# Patient Record
Sex: Female | Born: 1980 | Race: White | Hispanic: No | Marital: Single | Smoking: Former smoker
Health system: Southern US, Community
[De-identification: ages and names within clinical notes are randomized; demographics above are authoritative.]

## PROBLEM LIST (undated history)

## (undated) DIAGNOSIS — F4310 Post-traumatic stress disorder, unspecified: Secondary | ICD-10-CM

## (undated) DIAGNOSIS — F419 Anxiety disorder, unspecified: Secondary | ICD-10-CM

## (undated) DIAGNOSIS — F3181 Bipolar II disorder: Secondary | ICD-10-CM

## (undated) DIAGNOSIS — F329 Major depressive disorder, single episode, unspecified: Secondary | ICD-10-CM

## (undated) DIAGNOSIS — F32A Depression, unspecified: Secondary | ICD-10-CM

## (undated) DIAGNOSIS — D6851 Activated protein C resistance: Secondary | ICD-10-CM

## (undated) DIAGNOSIS — F309 Manic episode, unspecified: Secondary | ICD-10-CM

## (undated) DIAGNOSIS — F1910 Other psychoactive substance abuse, uncomplicated: Secondary | ICD-10-CM

---

## 2012-12-29 ENCOUNTER — Encounter (HOSPITAL_COMMUNITY): Payer: Self-pay | Admitting: Emergency Medicine

## 2012-12-29 ENCOUNTER — Emergency Department (HOSPITAL_COMMUNITY)
Admission: EM | Admit: 2012-12-29 | Discharge: 2012-12-29 | Disposition: A | Discharge status: Home / Self Care | Payer: Medicare Other | Attending: Emergency Medicine | Admitting: Emergency Medicine

## 2012-12-29 DIAGNOSIS — Z880 Allergy status to penicillin: Secondary | ICD-10-CM | POA: Insufficient documentation

## 2012-12-29 DIAGNOSIS — Z862 Personal history of diseases of the blood and blood-forming organs and certain disorders involving the immune mechanism: Secondary | ICD-10-CM | POA: Insufficient documentation

## 2012-12-29 DIAGNOSIS — Z8659 Personal history of other mental and behavioral disorders: Secondary | ICD-10-CM | POA: Insufficient documentation

## 2012-12-29 DIAGNOSIS — F17200 Nicotine dependence, unspecified, uncomplicated: Secondary | ICD-10-CM | POA: Insufficient documentation

## 2012-12-29 DIAGNOSIS — F411 Generalized anxiety disorder: Secondary | ICD-10-CM | POA: Insufficient documentation

## 2012-12-29 DIAGNOSIS — R112 Nausea with vomiting, unspecified: Secondary | ICD-10-CM | POA: Insufficient documentation

## 2012-12-29 DIAGNOSIS — F3189 Other bipolar disorder: Secondary | ICD-10-CM | POA: Insufficient documentation

## 2012-12-29 DIAGNOSIS — F1120 Opioid dependence, uncomplicated: Secondary | ICD-10-CM | POA: Insufficient documentation

## 2012-12-29 HISTORY — DX: Activated protein C resistance: D68.51

## 2012-12-29 HISTORY — DX: Manic episode, unspecified: F30.9

## 2012-12-29 HISTORY — DX: Bipolar II disorder: F31.81

## 2012-12-29 HISTORY — DX: Major depressive disorder, single episode, unspecified: F32.9

## 2012-12-29 HISTORY — DX: Depression, unspecified: F32.A

## 2012-12-29 HISTORY — DX: Anxiety disorder, unspecified: F41.9

## 2012-12-29 HISTORY — DX: Post-traumatic stress disorder, unspecified: F43.10

## 2012-12-29 LAB — CBC
HCT: 43.4 % (ref 36.0–46.0)
Hemoglobin: 14.6 g/dL (ref 12.0–15.0)
MCH: 31.9 pg (ref 26.0–34.0)
MCHC: 33.6 g/dL (ref 30.0–36.0)
MCV: 95 fL (ref 78.0–100.0)
Platelets: 181 10*3/uL (ref 150–400)
RBC: 4.57 MIL/uL (ref 3.87–5.11)
RDW: 12.6 % (ref 11.5–15.5)
WBC: 4.4 10*3/uL (ref 4.0–10.5)

## 2012-12-29 LAB — COMPREHENSIVE METABOLIC PANEL
ALT: 50 U/L — ABNORMAL HIGH (ref 0–35)
AST: 42 U/L — ABNORMAL HIGH (ref 0–37)
Albumin: 3.7 g/dL (ref 3.5–5.2)
Alkaline Phosphatase: 135 U/L — ABNORMAL HIGH (ref 39–117)
BUN: 12 mg/dL (ref 6–23)
CO2: 24 mEq/L (ref 19–32)
Calcium: 9.1 mg/dL (ref 8.4–10.5)
Chloride: 105 mEq/L (ref 96–112)
Creatinine, Ser: 0.79 mg/dL (ref 0.50–1.10)
GFR calc Af Amer: >90 mL/min (ref >90)
GFR calc non Af Amer: >90 mL/min (ref >90)
Glucose, Bld: 122 mg/dL — ABNORMAL HIGH (ref 70–99)
Potassium: 3.6 mEq/L (ref 3.5–5.1)
Sodium: 140 mEq/L (ref 135–145)
Total Bilirubin: 0.6 mg/dL (ref 0.3–1.2)
Total Protein: 7.6 g/dL (ref 6.0–8.3)

## 2012-12-29 LAB — ETHANOL: Alcohol, Ethyl (B): <11 mg/dL (ref 0–11)

## 2012-12-29 LAB — SALICYLATE LEVEL: Salicylate Lvl: <2 mg/dL — ABNORMAL LOW (ref 2.8–20.0)

## 2012-12-29 LAB — RAPID URINE DRUG SCREEN, HOSP PERFORMED
Amphetamines: NOT DETECTED
Barbiturates: NOT DETECTED
Benzodiazepines: POSITIVE — AB
Cocaine: POSITIVE — AB
Opiates: NOT DETECTED
Tetrahydrocannabinol: POSITIVE — AB

## 2012-12-29 LAB — ACETAMINOPHEN LEVEL: Acetaminophen (Tylenol), Serum: <15 ug/mL (ref 10–30)

## 2012-12-29 MED ORDER — ONDANSETRON HCL 4 MG PO TABS: 4.0000 mg | ORAL_TABLET | Freq: Four times a day (QID) | ORAL | Status: DC

## 2012-12-29 MED ORDER — HYDROCODONE-ACETAMINOPHEN 10-325 MG PO TABS: 1.0000 | ORAL_TABLET | Freq: Four times a day (QID) | ORAL | Status: DC

## 2012-12-29 MED ORDER — LORAZEPAM 1 MG PO TABS
1.0000 mg | ORAL_TABLET | Freq: Three times a day (TID) | ORAL | Status: DC | PRN
  Filled 2012-12-29: qty 1

## 2012-12-29 MED ORDER — DICYCLOMINE HCL 20 MG PO TABS: 20.0000 mg | ORAL_TABLET | Freq: Four times a day (QID) | ORAL | Status: DC | PRN

## 2012-12-29 MED ORDER — ZOLPIDEM TARTRATE 5 MG PO TABS: 5.0000 mg | ORAL_TABLET | Freq: Every evening | ORAL | Status: DC | PRN

## 2012-12-29 MED ORDER — HYDROXYZINE HCL 25 MG PO TABS: 50.0000 mg | ORAL_TABLET | Freq: Three times a day (TID) | ORAL | Status: DC | PRN

## 2012-12-29 MED ORDER — ROPINIROLE HCL 0.25 MG PO TABS
0.2500 mg | ORAL_TABLET | Freq: Every day | ORAL | Status: DC
  Filled 2012-12-29: qty 1

## 2012-12-29 MED ORDER — NICOTINE 21 MG/24HR TD PT24: 21.0000 mg | MEDICATED_PATCH | Freq: Every day | TRANSDERMAL | Status: DC | PRN

## 2012-12-29 MED ORDER — DIAZEPAM 5 MG PO TABS: 5.0000 mg | ORAL_TABLET | Freq: Three times a day (TID) | ORAL | Status: DC

## 2012-12-29 MED ORDER — ONDANSETRON HCL 4 MG PO TABS: 4.0000 mg | ORAL_TABLET | Freq: Three times a day (TID) | ORAL | Status: DC | PRN

## 2012-12-29 MED ORDER — ZIPRASIDONE HCL 20 MG PO CAPS: 40.0000 mg | ORAL_CAPSULE | Freq: Two times a day (BID) | ORAL | Status: DC

## 2012-12-29 MED ORDER — GABAPENTIN 100 MG PO CAPS
100.0000 mg | ORAL_CAPSULE | Freq: Three times a day (TID) | ORAL | Status: DC
  Filled 2012-12-29 (×2): qty 1

## 2012-12-29 MED ORDER — IBUPROFEN 400 MG PO TABS: 600.0000 mg | ORAL_TABLET | Freq: Three times a day (TID) | ORAL | Status: DC | PRN

## 2012-12-29 MED ORDER — CLONIDINE HCL 0.1 MG PO TABS: 0.1000 mg | ORAL_TABLET | Freq: Two times a day (BID) | ORAL | Status: DC

## 2012-12-29 MED ORDER — LORAZEPAM 1 MG PO TABS
1.0000 mg | ORAL_TABLET | Freq: Once | ORAL | Status: AC
  Administered 2012-12-29: 1 mg via ORAL
  Filled 2012-12-29: qty 2

## 2012-12-29 MED ORDER — LOPERAMIDE HCL 2 MG PO CAPS: 2.0000 mg | ORAL_CAPSULE | Freq: Four times a day (QID) | ORAL | Status: DC | PRN

## 2012-12-29 MED ORDER — ACETAMINOPHEN 325 MG PO TABS: 650.0000 mg | ORAL_TABLET | ORAL | Status: DC | PRN

## 2012-12-29 MED ORDER — ONDANSETRON 4 MG PO TBDP
4.0000 mg | ORAL_TABLET | Freq: Once | ORAL | Status: AC
  Administered 2012-12-29: 4 mg via ORAL
  Filled 2012-12-29: qty 1
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
## 2012-12-29 NOTE — ED Notes (Signed)
Pt reports she wants to get clean from shooting oxycodone, oxycontin, taking valium and smoking weed. Pt alert, orientedx4, NAD, tearful. Denies SI or HI. Cooperative with care.

## 2012-12-29 NOTE — ED Provider Notes (Signed)
History    CSN: [id number] Arrival date & time 12/29/12  1058  First MD Initiated Contact with Patient 12/29/12 1155     Chief Complaint  Patient presents with  . Medical Clearance   (Consider location/radiation/quality/duration/timing/severity/associated sxs/prior Treatment) The history is provided by the patient.   Patient presents to the ED for detox. Patient states she has been abusing narcotics for the past 3 years, specifically oxycodone, OxyContin, and Opana- last use late yesterday evening (1 PO oxycodone).  She states she injects, snorts, and orally take these medications.  States she has cut back on her use recently, but has been unable to completely "kick the habit" on her own.  She also admits to recent marijuana use- last use 3 days ago.  Denies any recent EtOH.  Prior detox 1.5 years ago- relapse due to emotional stress with significant other.  Patient states she is desiring help now because she feels like her addiction is interfering with her daily life and her ability to care for her children.  She has a strong support system at home to help facilitate this change.   Has been experiencing anxiety, nausea, vomiting, and diarrhea since earlier this am.  No tremors or seizure activity.  No chest pain, SOB, abdominal pain, dizziness, weakness, confusion, or AMS.  Denies any SI, HI, AVH.   Past Medical History  Diagnosis Date  . Factor 5 Leiden mutation, heterozygous   . Anxiety   . Bipolar 2 disorder   . Depression   . PTSD (post-traumatic stress disorder)   . Mania    History reviewed. No pertinent past surgical history. History reviewed. No pertinent family history. History  Substance Use Topics  . Smoking status: Current Every Day Smoker -- 0.50 packs/day    Types: Cigarettes  . Smokeless tobacco: Not on file  . Alcohol Use: No   OB History   Grav Para Term Preterm Abortions TAB SAB Ect Mult Living                 Review of Systems  Gastrointestinal: Positive  for nausea and vomiting.  Psychiatric/Behavioral:       Detox  All other systems reviewed and are negative.    Allergies  Review of patient's allergies indicates no known allergies.  Home Medications  No current outpatient prescriptions on file. BP 126/80  Pulse 94  Temp(Src) 97.7 F (36.5 C) (Oral)  SpO2 100%  LMP 12/29/2012  Physical Exam  Nursing note and vitals reviewed. Constitutional: She is oriented to person, place, and time. She appears well-developed and well-nourished.  HENT:  Head: Normocephalic and atraumatic.  Mouth/Throat: Oropharynx is clear and moist.  Eyes: Conjunctivae and EOM are normal. Pupils are equal, round, and reactive to light.  Neck: Normal range of motion.  Cardiovascular: Normal rate, regular rhythm and normal heart sounds.   Pulmonary/Chest: Effort normal and breath sounds normal.  Abdominal: Soft. Bowel sounds are normal.  Musculoskeletal: Normal range of motion.  Neurological: She is alert and oriented to person, place, and time.  Skin: Skin is warm and dry.  Psychiatric: Her speech is normal. Thought content normal. Her mood appears anxious. She expresses no homicidal and no suicidal ideation. She expresses no suicidal plans and no homicidal plans.  Pt anxious and tearful, no SI/HI/AVH    ED Course  Procedures (including critical care time) Labs Reviewed  COMPREHENSIVE METABOLIC PANEL - Abnormal; Notable for the following:    Glucose, Bld 122 (*)    AST 42 (*)  ALT 50 (*)    Alkaline Phosphatase 135 (*)    All other components within normal limits  SALICYLATE LEVEL - Abnormal; Notable for the following:    Salicylate Lvl <2.0 (*)    All other components within normal limits  URINE RAPID DRUG SCREEN (HOSP PERFORMED) - Abnormal; Notable for the following:    Cocaine POSITIVE (*)    Benzodiazepines POSITIVE (*)    Tetrahydrocannabinol POSITIVE (*)    All other components within normal limits  ACETAMINOPHEN LEVEL  CBC  ETHANOL   POCT PREGNANCY, URINE   No results found. No diagnosis found.  MDM   Labs as above, urine drug screen positive for cocaine benzodiazepine and marijuana.  Patient is agitated and nauseous at my initial evaluation- Ativan and Zofran given.  Patient medically cleared and removed to psych hold.  Holding orders and home meds placed.  [facility] consulted, they will see her.   Garlon Hatchet, PA-C 12/29/12 1542

## 2012-12-29 NOTE — ED Notes (Addendum)
Pt states she does five Roxicet and "I shoot 2-40's" per day. "I'm dying..I'm having diarrhea, feel like I'm going to vomit.Peggye Form got the shakes". "When can I get something for my sx".

## 2012-12-29 NOTE — ED Notes (Signed)
Has an appt tomorrow with [facility] for refill on meds

## 2012-12-29 NOTE — ED Notes (Signed)
Wants detox from opiates--

## 2012-12-31 NOTE — ED Provider Notes (Signed)
Medical screening examination/treatment/procedure(s) were performed by non-physician practitioner and as supervising physician I was immediately available for consultation/collaboration.  Angelina Venard, MD 12/31/12 1701 

## 2013-07-10 DIAGNOSIS — H5210 Myopia, unspecified eye: Secondary | ICD-10-CM | POA: Diagnosis not present

## 2013-07-10 DIAGNOSIS — Z79899 Other long term (current) drug therapy: Secondary | ICD-10-CM | POA: Diagnosis not present

## 2013-07-10 DIAGNOSIS — Z5181 Encounter for therapeutic drug level monitoring: Secondary | ICD-10-CM | POA: Diagnosis not present

## 2013-07-10 DIAGNOSIS — M545 Low back pain, unspecified: Secondary | ICD-10-CM | POA: Diagnosis not present

## 2013-07-10 DIAGNOSIS — G4700 Insomnia, unspecified: Secondary | ICD-10-CM | POA: Diagnosis not present

## 2013-07-10 DIAGNOSIS — H43399 Other vitreous opacities, unspecified eye: Secondary | ICD-10-CM | POA: Diagnosis not present

## 2013-07-21 DIAGNOSIS — F4310 Post-traumatic stress disorder, unspecified: Secondary | ICD-10-CM | POA: Diagnosis not present

## 2013-07-24 ENCOUNTER — Encounter: Payer: Medicare Other | Admitting: Obstetrics & Gynecology

## 2013-08-07 DIAGNOSIS — G4700 Insomnia, unspecified: Secondary | ICD-10-CM | POA: Diagnosis not present

## 2013-08-07 DIAGNOSIS — R5383 Other fatigue: Secondary | ICD-10-CM | POA: Diagnosis not present

## 2013-08-07 DIAGNOSIS — G2581 Restless legs syndrome: Secondary | ICD-10-CM | POA: Diagnosis not present

## 2013-08-07 DIAGNOSIS — M545 Low back pain, unspecified: Secondary | ICD-10-CM | POA: Diagnosis not present

## 2013-08-07 DIAGNOSIS — R5381 Other malaise: Secondary | ICD-10-CM | POA: Diagnosis not present

## 2013-09-04 DIAGNOSIS — R5381 Other malaise: Secondary | ICD-10-CM | POA: Diagnosis not present

## 2013-09-04 DIAGNOSIS — R5383 Other fatigue: Secondary | ICD-10-CM | POA: Diagnosis not present

## 2013-09-04 DIAGNOSIS — G4700 Insomnia, unspecified: Secondary | ICD-10-CM | POA: Diagnosis not present

## 2013-09-04 DIAGNOSIS — M545 Low back pain, unspecified: Secondary | ICD-10-CM | POA: Diagnosis not present

## 2013-09-29 DIAGNOSIS — F17200 Nicotine dependence, unspecified, uncomplicated: Secondary | ICD-10-CM | POA: Diagnosis not present

## 2013-09-29 DIAGNOSIS — J069 Acute upper respiratory infection, unspecified: Secondary | ICD-10-CM | POA: Diagnosis not present

## 2013-10-17 DIAGNOSIS — M545 Low back pain, unspecified: Secondary | ICD-10-CM | POA: Diagnosis not present

## 2013-10-17 DIAGNOSIS — G2581 Restless legs syndrome: Secondary | ICD-10-CM | POA: Diagnosis not present

## 2013-10-17 DIAGNOSIS — G4700 Insomnia, unspecified: Secondary | ICD-10-CM | POA: Diagnosis not present

## 2013-10-17 DIAGNOSIS — R5381 Other malaise: Secondary | ICD-10-CM | POA: Diagnosis not present

## 2013-10-24 DIAGNOSIS — D485 Neoplasm of uncertain behavior of skin: Secondary | ICD-10-CM | POA: Diagnosis not present

## 2013-10-24 DIAGNOSIS — L723 Sebaceous cyst: Secondary | ICD-10-CM | POA: Diagnosis not present

## 2013-10-24 DIAGNOSIS — L819 Disorder of pigmentation, unspecified: Secondary | ICD-10-CM | POA: Diagnosis not present

## 2013-10-24 DIAGNOSIS — L578 Other skin changes due to chronic exposure to nonionizing radiation: Secondary | ICD-10-CM | POA: Diagnosis not present

## 2013-10-25 ENCOUNTER — Emergency Department (HOSPITAL_COMMUNITY)
Admission: EM | Admit: 2013-10-25 | Discharge: 2013-10-25 | Disposition: A | Discharge status: Home / Self Care | Payer: Medicare Other | Attending: Emergency Medicine | Admitting: Emergency Medicine

## 2013-10-25 ENCOUNTER — Encounter (HOSPITAL_COMMUNITY): Payer: Self-pay | Admitting: Emergency Medicine

## 2013-10-25 ENCOUNTER — Emergency Department (HOSPITAL_COMMUNITY): Payer: Medicare Other

## 2013-10-25 DIAGNOSIS — R05 Cough: Secondary | ICD-10-CM

## 2013-10-25 DIAGNOSIS — R0602 Shortness of breath: Secondary | ICD-10-CM | POA: Diagnosis not present

## 2013-10-25 DIAGNOSIS — R059 Cough, unspecified: Secondary | ICD-10-CM | POA: Insufficient documentation

## 2013-10-25 DIAGNOSIS — F319 Bipolar disorder, unspecified: Secondary | ICD-10-CM | POA: Diagnosis not present

## 2013-10-25 DIAGNOSIS — Z880 Allergy status to penicillin: Secondary | ICD-10-CM | POA: Diagnosis not present

## 2013-10-25 DIAGNOSIS — Z862 Personal history of diseases of the blood and blood-forming organs and certain disorders involving the immune mechanism: Secondary | ICD-10-CM | POA: Insufficient documentation

## 2013-10-25 DIAGNOSIS — F4310 Post-traumatic stress disorder, unspecified: Secondary | ICD-10-CM | POA: Insufficient documentation

## 2013-10-25 DIAGNOSIS — F411 Generalized anxiety disorder: Secondary | ICD-10-CM | POA: Diagnosis not present

## 2013-10-25 DIAGNOSIS — Z79899 Other long term (current) drug therapy: Secondary | ICD-10-CM | POA: Insufficient documentation

## 2013-10-25 DIAGNOSIS — Z87891 Personal history of nicotine dependence: Secondary | ICD-10-CM | POA: Diagnosis not present

## 2013-10-25 NOTE — ED Notes (Signed)
Waiting for xray to be read.

## 2013-10-25 NOTE — ED Provider Notes (Signed)
CSN: [id number]     Arrival date & time 10/25/13  2536 History  This chart was scribed for non-physician practitioner working with Alvina Chou, by Allena Earing ED Scribe. This patient was seen in TR07C/TR07C and the patient's care was started at 10:32 AM.     Chief Complaint  Patient presents with  . URI      Patient is a 33 y.o. female presenting with URI. The history is provided by the patient. No language interpreter was used.  URI Presenting symptoms: cough   Presenting symptoms: no fever     HPI Comments: Rileyann Florance is a 33 y.o. female who presents to the Emergency Department complaining of intermittent SOB that has been present since January. Pt states that she will get well for a few weeks and then her symptoms return. She recently been  diagnosed with bronchitis and URI when visiting the ED, she was last seen for her symptoms at [facility] 3 weeks ago. She reports that since smoking "spice", she has been coughing up thick, greenish sputum. She stopped smoking 26 days ago, she had been smoking for 20 years. She also reports her skin is "red".  Pt is on methadone. Pt has Hep C, she knows very little about this.   Past Medical History  Diagnosis Date  . Factor 5 Leiden mutation, heterozygous   . Anxiety   . Bipolar 2 disorder   . Depression   . PTSD (post-traumatic stress disorder)   . Mania    History reviewed. No pertinent past surgical history. History reviewed. No pertinent family history. History  Substance Use Topics  . Smoking status: Former Smoker -- 0.50 packs/day    Types: Cigarettes  . Smokeless tobacco: Current User    Types: Chew  . Alcohol Use: No   OB History   Grav Para Term Preterm Abortions TAB SAB Ect Mult Living                 Review of Systems  Constitutional: Negative for fever.  Respiratory: Positive for cough and shortness of breath.   Psychiatric/Behavioral: Negative for confusion.  All other systems reviewed and  are negative.     Allergies  Penicillins and Seroquel  Home Medications   Prior to Admission medications   Medication Sig Start Date End Date Taking? Authorizing Provider  Aspirin-Acetaminophen-Caffeine (GOODY HEADACHE PO) Take 1 packet by mouth daily as needed (headache).    Historical Provider, MD  cloNIDine (CATAPRES) 0.1 MG tablet Take 1 tablet (0.1 mg total) by mouth 2 (two) times daily. 12/29/12   Virgel Manifold, MD  diazepam (VALIUM) 5 MG tablet Take 5 mg by mouth 3 (three) times daily.    Historical Provider, MD  dicyclomine (BENTYL) 20 MG tablet Take 1 tablet (20 mg total) by mouth every 6 (six) hours as needed. 12/29/12   Virgel Manifold, MD  gabapentin (NEURONTIN) 100 MG capsule Take 100 mg by mouth 3 (three) times daily.    Historical Provider, MD  HYDROcodone-acetaminophen (NORCO) 10-325 MG per tablet Take 1 tablet by mouth 4 (four) times daily.    Historical Provider, MD  hydrOXYzine (ATARAX/VISTARIL) 50 MG tablet Take 50 mg by mouth 3 (three) times daily as needed for anxiety.    Historical Provider, MD  loperamide (IMODIUM) 2 MG capsule Take 1 capsule (2 mg total) by mouth 4 (four) times daily as needed for diarrhea or loose stools. 12/29/12   Virgel Manifold, MD  ondansetron (ZOFRAN) 4 MG tablet Take 1 tablet (4  mg total) by mouth every 6 (six) hours. 12/29/12   Virgel Manifold, MD  rOPINIRole (REQUIP) 0.25 MG tablet Take 0.25 mg by mouth at bedtime.    Historical Provider, MD  ziprasidone (GEODON) 40 MG capsule Take 40 mg by mouth 2 (two) times daily with a meal.    Historical Provider, MD   BP 136/84  Pulse 80  Temp(Src) 98.7 F (37.1 C) (Oral)  Resp 16  Ht 5\' 8"  (1.727 m)  Wt 199 lb 4.8 oz (90.402 kg)  BMI 30.31 kg/m2  SpO2 100% Physical Exam  Nursing note and vitals reviewed. Constitutional: She is oriented to person, place, and time. She appears well-developed and well-nourished.  Eyes: Pupils are equal, round, and reactive to light.  Neck: Normal range of motion.   Pulmonary/Chest: Effort normal.  Musculoskeletal: Normal range of motion.  Neurological: She is alert and oriented to person, place, and time.  Psychiatric: She has a normal mood and affect. Her behavior is normal.    ED Course  Procedures (including critical care time)  DIAGNOSTIC STUDIES: Oxygen Saturation is 100% on RA, normal by my interpretation.    COORDINATION OF CARE:   10:38 AM-Discussed treatment plan which includes XRAY, informing her of the damage caused by 20 years of smoking with pt at bedside and pt agreed to plan.   Labs Review Labs Reviewed - No data to display  Imaging Review No results found.   EKG Interpretation None      MDM   Final diagnoses:  Cough   I personally performed the services described in this documentation, which was scribed in my presence. The recorded information has been reviewed and is accurate.    11:35 AM Patient's xray unremarkable for acute changes. Patient will be discharged with instructions to follow up with PCP. Vitals stable and patient afebrile. Patient likely has SOB due to smoking history.     Alvina Chou, PA-C 10/25/13 1442

## 2013-10-25 NOTE — ED Provider Notes (Signed)
Medical screening examination/treatment/procedure(s) were performed by non-physician practitioner and as supervising physician I was immediately available for consultation/collaboration.   EKG Interpretation None        Alfonzo Feller, DO 10/25/13 1920

## 2013-10-25 NOTE — ED Notes (Addendum)
She was diagnosed with a respiratory infection at [facility] 3 weeks ago. She continues to "feel like i cant catch my breath sometimes and my skin looks red." she states shes had a dry cough for 2 years

## 2013-10-25 NOTE — Discharge Instructions (Signed)
Follow up with your doctor for further evaluation

## 2013-10-25 NOTE — ED Notes (Signed)
Pt has been to [facility] x 3 over past 3-4 months for URI. States they did a CXR in January but none since. Pt states she started with cough, sob and cp since she started "smoking spice" 3 years ago. Pt smoked spice for 1-2 years but not currently. She now takes Methadone. States she was coughing up "jelly-like" sputum when she was smoking spice but now sputum is green/black. Pt stopped smoking cigarettes 26 days ago. Is currently chewing tobacco in order to keep from smoking. Pt is concerned that the Spice has done something "bad" to her lungs.

## 2013-10-31 DIAGNOSIS — F4310 Post-traumatic stress disorder, unspecified: Secondary | ICD-10-CM | POA: Diagnosis not present

## 2013-11-14 DIAGNOSIS — M545 Low back pain, unspecified: Secondary | ICD-10-CM | POA: Diagnosis not present

## 2013-11-14 DIAGNOSIS — R5381 Other malaise: Secondary | ICD-10-CM | POA: Diagnosis not present

## 2013-11-14 DIAGNOSIS — G4700 Insomnia, unspecified: Secondary | ICD-10-CM | POA: Diagnosis not present

## 2013-11-14 DIAGNOSIS — G2581 Restless legs syndrome: Secondary | ICD-10-CM | POA: Diagnosis not present

## 2013-11-14 DIAGNOSIS — R5383 Other fatigue: Secondary | ICD-10-CM | POA: Diagnosis not present

## 2014-08-13 DIAGNOSIS — J209 Acute bronchitis, unspecified: Secondary | ICD-10-CM | POA: Diagnosis not present

## 2014-08-13 DIAGNOSIS — R5382 Chronic fatigue, unspecified: Secondary | ICD-10-CM | POA: Diagnosis not present

## 2014-08-13 DIAGNOSIS — G8929 Other chronic pain: Secondary | ICD-10-CM | POA: Diagnosis not present

## 2014-10-31 DIAGNOSIS — F17210 Nicotine dependence, cigarettes, uncomplicated: Secondary | ICD-10-CM | POA: Diagnosis not present

## 2014-10-31 DIAGNOSIS — J029 Acute pharyngitis, unspecified: Secondary | ICD-10-CM | POA: Diagnosis not present

## 2014-10-31 DIAGNOSIS — Z79899 Other long term (current) drug therapy: Secondary | ICD-10-CM | POA: Diagnosis not present

## 2014-10-31 DIAGNOSIS — J45909 Unspecified asthma, uncomplicated: Secondary | ICD-10-CM | POA: Diagnosis not present

## 2014-12-24 DIAGNOSIS — Z79899 Other long term (current) drug therapy: Secondary | ICD-10-CM | POA: Diagnosis not present

## 2014-12-28 DIAGNOSIS — R110 Nausea: Secondary | ICD-10-CM | POA: Diagnosis not present

## 2014-12-28 DIAGNOSIS — R1010 Upper abdominal pain, unspecified: Secondary | ICD-10-CM | POA: Diagnosis not present

## 2014-12-28 DIAGNOSIS — F4310 Post-traumatic stress disorder, unspecified: Secondary | ICD-10-CM | POA: Diagnosis not present

## 2014-12-28 DIAGNOSIS — F603 Borderline personality disorder: Secondary | ICD-10-CM | POA: Diagnosis not present

## 2014-12-28 DIAGNOSIS — F17210 Nicotine dependence, cigarettes, uncomplicated: Secondary | ICD-10-CM | POA: Diagnosis not present

## 2014-12-28 DIAGNOSIS — F319 Bipolar disorder, unspecified: Secondary | ICD-10-CM | POA: Diagnosis not present

## 2015-01-01 DIAGNOSIS — G4700 Insomnia, unspecified: Secondary | ICD-10-CM | POA: Diagnosis not present

## 2015-01-01 DIAGNOSIS — G894 Chronic pain syndrome: Secondary | ICD-10-CM | POA: Diagnosis not present

## 2015-01-01 DIAGNOSIS — Z5181 Encounter for therapeutic drug level monitoring: Secondary | ICD-10-CM | POA: Diagnosis not present

## 2015-01-01 DIAGNOSIS — R1084 Generalized abdominal pain: Secondary | ICD-10-CM | POA: Diagnosis not present

## 2015-01-01 DIAGNOSIS — K5900 Constipation, unspecified: Secondary | ICD-10-CM | POA: Diagnosis not present

## 2015-01-01 DIAGNOSIS — Z79891 Long term (current) use of opiate analgesic: Secondary | ICD-10-CM | POA: Diagnosis not present

## 2015-01-03 DIAGNOSIS — Z113 Encounter for screening for infections with a predominantly sexual mode of transmission: Secondary | ICD-10-CM | POA: Diagnosis not present

## 2015-01-03 DIAGNOSIS — E785 Hyperlipidemia, unspecified: Secondary | ICD-10-CM | POA: Diagnosis not present

## 2015-01-03 DIAGNOSIS — M1990 Unspecified osteoarthritis, unspecified site: Secondary | ICD-10-CM | POA: Diagnosis not present

## 2015-01-03 DIAGNOSIS — R5383 Other fatigue: Secondary | ICD-10-CM | POA: Diagnosis not present

## 2015-01-03 DIAGNOSIS — E039 Hypothyroidism, unspecified: Secondary | ICD-10-CM | POA: Diagnosis not present

## 2015-01-09 DIAGNOSIS — R1084 Generalized abdominal pain: Secondary | ICD-10-CM | POA: Diagnosis not present

## 2015-01-09 DIAGNOSIS — R1013 Epigastric pain: Secondary | ICD-10-CM | POA: Diagnosis not present

## 2015-01-09 DIAGNOSIS — K5900 Constipation, unspecified: Secondary | ICD-10-CM | POA: Diagnosis not present

## 2015-01-09 DIAGNOSIS — B1920 Unspecified viral hepatitis C without hepatic coma: Secondary | ICD-10-CM | POA: Diagnosis not present

## 2015-01-11 DIAGNOSIS — R1084 Generalized abdominal pain: Secondary | ICD-10-CM | POA: Diagnosis not present

## 2015-01-11 DIAGNOSIS — B1920 Unspecified viral hepatitis C without hepatic coma: Secondary | ICD-10-CM | POA: Diagnosis not present

## 2015-01-14 DIAGNOSIS — R1013 Epigastric pain: Secondary | ICD-10-CM | POA: Diagnosis not present

## 2015-01-24 DIAGNOSIS — K5909 Other constipation: Secondary | ICD-10-CM | POA: Diagnosis not present

## 2015-01-24 DIAGNOSIS — B1920 Unspecified viral hepatitis C without hepatic coma: Secondary | ICD-10-CM | POA: Diagnosis not present

## 2015-01-24 DIAGNOSIS — R1013 Epigastric pain: Secondary | ICD-10-CM | POA: Diagnosis not present

## 2015-01-24 DIAGNOSIS — R1084 Generalized abdominal pain: Secondary | ICD-10-CM | POA: Diagnosis not present

## 2015-02-25 DIAGNOSIS — G4700 Insomnia, unspecified: Secondary | ICD-10-CM | POA: Diagnosis not present

## 2015-02-25 DIAGNOSIS — R5382 Chronic fatigue, unspecified: Secondary | ICD-10-CM | POA: Diagnosis not present

## 2015-02-25 DIAGNOSIS — I10 Essential (primary) hypertension: Secondary | ICD-10-CM | POA: Diagnosis not present

## 2015-02-25 DIAGNOSIS — G894 Chronic pain syndrome: Secondary | ICD-10-CM | POA: Diagnosis not present

## 2015-04-15 DIAGNOSIS — L02412 Cutaneous abscess of left axilla: Secondary | ICD-10-CM | POA: Diagnosis not present

## 2015-04-15 DIAGNOSIS — F17200 Nicotine dependence, unspecified, uncomplicated: Secondary | ICD-10-CM | POA: Diagnosis not present

## 2015-06-03 DIAGNOSIS — R079 Chest pain, unspecified: Secondary | ICD-10-CM | POA: Diagnosis not present

## 2015-06-06 DIAGNOSIS — Z79899 Other long term (current) drug therapy: Secondary | ICD-10-CM | POA: Diagnosis not present

## 2015-07-23 DIAGNOSIS — L03112 Cellulitis of left axilla: Secondary | ICD-10-CM | POA: Diagnosis not present

## 2015-07-23 DIAGNOSIS — L02412 Cutaneous abscess of left axilla: Secondary | ICD-10-CM | POA: Diagnosis not present

## 2015-08-13 DIAGNOSIS — G894 Chronic pain syndrome: Secondary | ICD-10-CM | POA: Diagnosis not present

## 2015-08-13 DIAGNOSIS — F4312 Post-traumatic stress disorder, chronic: Secondary | ICD-10-CM | POA: Diagnosis not present

## 2015-08-13 DIAGNOSIS — G609 Hereditary and idiopathic neuropathy, unspecified: Secondary | ICD-10-CM | POA: Diagnosis not present

## 2015-08-13 DIAGNOSIS — F319 Bipolar disorder, unspecified: Secondary | ICD-10-CM | POA: Diagnosis not present

## 2015-08-22 ENCOUNTER — Other Ambulatory Visit (HOSPITAL_COMMUNITY): Payer: Self-pay | Admitting: Gastroenterology

## 2015-08-22 DIAGNOSIS — B1920 Unspecified viral hepatitis C without hepatic coma: Secondary | ICD-10-CM | POA: Diagnosis not present

## 2015-08-22 DIAGNOSIS — B182 Chronic viral hepatitis C: Secondary | ICD-10-CM

## 2015-09-03 DIAGNOSIS — L02414 Cutaneous abscess of left upper limb: Secondary | ICD-10-CM | POA: Diagnosis not present

## 2015-09-03 DIAGNOSIS — L02412 Cutaneous abscess of left axilla: Secondary | ICD-10-CM | POA: Diagnosis not present

## 2015-09-11 ENCOUNTER — Ambulatory Visit (HOSPITAL_COMMUNITY)
Admission: RE | Admit: 2015-09-11 | Discharge: 2015-09-11 | Disposition: A | Discharge status: Home / Self Care | Payer: Medicare Other | Source: Ambulatory Visit | Attending: Gastroenterology | Admitting: Gastroenterology

## 2015-09-11 DIAGNOSIS — B182 Chronic viral hepatitis C: Secondary | ICD-10-CM

## 2015-10-07 DIAGNOSIS — B958 Unspecified staphylococcus as the cause of diseases classified elsewhere: Secondary | ICD-10-CM | POA: Diagnosis not present

## 2015-10-07 DIAGNOSIS — L089 Local infection of the skin and subcutaneous tissue, unspecified: Secondary | ICD-10-CM | POA: Diagnosis not present

## 2016-02-06 DIAGNOSIS — B1920 Unspecified viral hepatitis C without hepatic coma: Secondary | ICD-10-CM | POA: Diagnosis not present

## 2016-04-29 ENCOUNTER — Emergency Department (HOSPITAL_COMMUNITY)
Admission: EM | Admit: 2016-04-29 | Discharge: 2016-04-29 | Disposition: A | Discharge status: Home / Self Care | Payer: Medicare Other | Attending: Emergency Medicine | Admitting: Emergency Medicine

## 2016-04-29 ENCOUNTER — Emergency Department (HOSPITAL_COMMUNITY): Payer: Medicare Other

## 2016-04-29 ENCOUNTER — Encounter (HOSPITAL_COMMUNITY): Payer: Self-pay | Admitting: *Deleted

## 2016-04-29 DIAGNOSIS — Z87891 Personal history of nicotine dependence: Secondary | ICD-10-CM | POA: Insufficient documentation

## 2016-04-29 DIAGNOSIS — Z7982 Long term (current) use of aspirin: Secondary | ICD-10-CM | POA: Insufficient documentation

## 2016-04-29 DIAGNOSIS — J189 Pneumonia, unspecified organism: Secondary | ICD-10-CM | POA: Insufficient documentation

## 2016-04-29 DIAGNOSIS — R071 Chest pain on breathing: Secondary | ICD-10-CM | POA: Diagnosis present

## 2016-04-29 DIAGNOSIS — R079 Chest pain, unspecified: Secondary | ICD-10-CM | POA: Diagnosis not present

## 2016-04-29 DIAGNOSIS — R0789 Other chest pain: Secondary | ICD-10-CM | POA: Diagnosis not present

## 2016-04-29 DIAGNOSIS — R0602 Shortness of breath: Secondary | ICD-10-CM | POA: Diagnosis not present

## 2016-04-29 HISTORY — DX: Other psychoactive substance abuse, uncomplicated: F19.10

## 2016-04-29 LAB — CBC WITH DIFFERENTIAL/PLATELET
BASOS PCT: 0 %
Basophils Absolute: 0 10*3/uL (ref 0.0–0.1)
Eosinophils Absolute: 0.1 10*3/uL (ref 0.0–0.7)
Eosinophils Relative: 1 %
HEMATOCRIT: 41.7 % (ref 36.0–46.0)
HEMOGLOBIN: 14.1 g/dL (ref 12.0–15.0)
Lymphocytes Relative: 38 %
Lymphs Abs: 2.4 10*3/uL (ref 0.7–4.0)
MCH: 32.4 pg (ref 26.0–34.0)
MCHC: 33.8 g/dL (ref 30.0–36.0)
MCV: 95.9 fL (ref 78.0–100.0)
MONOS PCT: 11 %
Monocytes Absolute: 0.7 10*3/uL (ref 0.1–1.0)
NEUTROS ABS: 3.3 10*3/uL (ref 1.7–7.7)
NEUTROS PCT: 50 %
Platelets: 166 10*3/uL (ref 150–400)
RBC: 4.35 MIL/uL (ref 3.87–5.11)
RDW: 12.5 % (ref 11.5–15.5)
WBC: 6.5 10*3/uL (ref 4.0–10.5)

## 2016-04-29 LAB — BASIC METABOLIC PANEL
ANION GAP: 8 (ref 5–15)
BUN: <5 mg/dL — ABNORMAL LOW (ref 6–20)
CHLORIDE: 101 mmol/L (ref 101–111)
CO2: 28 mmol/L (ref 22–32)
Calcium: 8.9 mg/dL (ref 8.9–10.3)
Creatinine, Ser: 0.74 mg/dL (ref 0.44–1.00)
GFR calc non Af Amer: >60 mL/min (ref >60)
Glucose, Bld: 95 mg/dL (ref 65–99)
Potassium: 3.8 mmol/L (ref 3.5–5.1)
Sodium: 137 mmol/L (ref 135–145)

## 2016-04-29 LAB — I-STAT BETA HCG BLOOD, ED (MC, WL, AP ONLY): I-stat hCG, quantitative: <5 m[IU]/mL (ref <5)

## 2016-04-29 MED ORDER — IBUPROFEN 600 MG PO TABS: 600.0000 mg | ORAL_TABLET | Freq: Four times a day (QID) | ORAL | 0 refills | Status: DC | PRN

## 2016-04-29 MED ORDER — IOPAMIDOL (ISOVUE-370) INJECTION 76%
INTRAVENOUS | Status: AC
  Filled 2016-04-29: qty 100

## 2016-04-29 MED ORDER — DOXYCYCLINE HYCLATE 100 MG PO CAPS: 100.0000 mg | ORAL_CAPSULE | Freq: Two times a day (BID) | ORAL | 0 refills | Status: DC

## 2016-04-29 MED ORDER — KETOROLAC TROMETHAMINE 30 MG/ML IJ SOLN
30.0000 mg | Freq: Once | INTRAMUSCULAR | Status: AC
Start: 2016-04-29 — End: 2016-04-29
  Administered 2016-04-29: 30 mg via INTRAMUSCULAR
  Filled 2016-04-29: qty 1

## 2016-04-29 MED ORDER — BENZONATATE 100 MG PO CAPS: 200.0000 mg | ORAL_CAPSULE | Freq: Two times a day (BID) | ORAL | 0 refills | Status: DC | PRN

## 2016-04-29 MED ORDER — IBUPROFEN 800 MG PO TABS
800.0000 mg | ORAL_TABLET | Freq: Once | ORAL | Status: AC
  Administered 2016-04-29: 800 mg via ORAL
  Filled 2016-04-29: qty 1

## 2016-04-29 MED ORDER — IOPAMIDOL (ISOVUE-370) INJECTION 76%
INTRAVENOUS | Status: AC
  Administered 2016-04-29: 100 mL
  Filled 2016-04-29: qty 100

## 2016-04-29 NOTE — ED Triage Notes (Signed)
Pt is here for pain under left ribs, epigastric area.  This pain began yesterday as an ache and is increased today.  Pain increases with movement and wraps around to her back.  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  Pt also has a clotting disorder.

## 2016-04-29 NOTE — ED Notes (Signed)
CT notified that pt has IV for CT Angio at this time

## 2016-04-29 NOTE — ED Notes (Signed)
Patient transported to X-ray 

## 2016-04-29 NOTE — ED Provider Notes (Signed)
[facility] DEPT Provider Note   CSN: [id number] Arrival date & time: 04/29/16  1128     History   Chief Complaint Chief Complaint  Patient presents with  . Abdominal Pain    HPI Lisa Mendoza is a 35 y.o. female.  Patient with hx of Factor 5 Leiden, presents to the emergency department with chief complaint of left rib pain. She states symptoms started yesterday. She reports increased pain when she breathes deeply. She denies any known trauma or injury. She denies any lower abdominal pain. Denies any fevers or chills, she states that she has had a cough. She denies any history of PE or DVT.  There are no other associated symptoms. She has not taken anything for her pain. 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   The history is provided by the patient. No language interpreter was used.    Past Medical History:  Diagnosis Date  . Anxiety   . Bipolar 2 disorder ([place])   . Depression   . Drug abuse    heroin and meth  . Factor 5 Leiden mutation, heterozygous ([place])   . Mania ([place])   . PTSD (post-traumatic stress disorder)     There are no active problems to display for this patient.   History reviewed. No pertinent surgical history.  OB History    No data available       Home Medications    Prior to Admission medications   Medication Sig Start Date End Date Taking? Authorizing Provider  Aspirin-Acetaminophen-Caffeine (GOODY HEADACHE PO) Take 1 packet by mouth daily as needed (headache).    Historical Provider, MD  gabapentin (NEURONTIN) 100 MG capsule Take 100 mg by mouth 3 (three) times daily.    Historical Provider, MD  hydrOXYzine (ATARAX/VISTARIL) 50 MG tablet Take 50 mg by mouth 3 (three) times daily as needed for anxiety.    Historical Provider, MD  methadone (DOLOPHINE) 10 MG/ML solution Take 90 mg by mouth daily.    Historical Provider, MD  rOPINIRole (REQUIP) 0.25 MG tablet Take 0.25 mg by mouth at bedtime.    Historical Provider, MD    ziprasidone (GEODON) 40 MG capsule Take 40 mg by mouth 2 (two) times daily with a meal.    Historical Provider, MD    Family History No family history on file.  Social History Social History  Substance Use Topics  . Smoking status: Former Smoker    Packs/day: 0.50    Types: Cigarettes  . Smokeless tobacco: Current User    Types: Chew  . Alcohol use No     Allergies   Penicillins and Seroquel [quetiapine]   Review of Systems Review of Systems  Respiratory: Positive for shortness of breath.   Cardiovascular: Positive for chest pain.  All other systems reviewed and are negative.    Physical Exam Updated Vital Signs BP 130/85 (BP Location: Right Arm)   Pulse 92   Temp 98.7 F (37.1 C) (Oral)   Resp 17   Ht 5\' 8"  (1.727 m)   Wt 89.4 kg   SpO2 97%   BMI 29.95 kg/m   Physical Exam  Constitutional: She is oriented to person, place, and time. She appears well-developed and well-nourished.  HENT:  Head: Normocephalic and atraumatic.  Eyes: Conjunctivae and EOM are normal. Pupils are equal, round, and reactive to light.  Neck: Normal range of motion. Neck supple.  Cardiovascular: Normal rate and regular rhythm.  Exam reveals no gallop and no friction rub.  No murmur heard. Pulmonary/Chest: Effort normal and breath sounds normal. No respiratory distress. She has no wheezes. She has no rales. She exhibits no tenderness.  Abdominal: Soft. Bowel sounds are normal. She exhibits no distension and no mass. There is no tenderness. There is no rebound and no guarding.  Musculoskeletal: Normal range of motion. She exhibits no edema or tenderness.  Neurological: She is alert and oriented to person, place, and time.  Skin: Skin is warm and dry.  Psychiatric: She has a normal mood and affect. Her behavior is normal. Judgment and thought content normal.  Nursing note and vitals reviewed.    ED Treatments / Results  Labs (all labs ordered are listed, but only abnormal results  are displayed) Labs Reviewed  BASIC METABOLIC PANEL - Abnormal; Notable for the following:       Result Value   BUN <5 (*)    All other components within normal limits  CBC WITH DIFFERENTIAL/PLATELET  I-STAT BETA HCG BLOOD, ED ([facility], [facility], [facility] ONLY)    EKG  EKG Interpretation  Date/Time:  Wednesday April 29 2016 11:34:54 EDT Ventricular Rate:  83 PR Interval:    QRS Duration: 94 QT Interval:  354 QTC Calculation: 416 R Axis:   31 Text Interpretation:  Sinus rhythm No previous tracing Confirmed by Ashok Cordia  MD, Lennette Bihari ([id number]) on 04/29/2016 11:57:08 AM Also confirmed by Ashok Cordia  MD, Lennette Bihari ([id number]), editor Thayer, Joelene Millin [phone])  on 04/29/2016 12:07:42 PM       Radiology Dg Chest 2 View  Result Date: 04/29/2016 CLINICAL DATA:  Shortness of breath.  Chest pain . EXAM: CHEST  2 VIEW COMPARISON:  06/03/2015 . FINDINGS: Left lower lobe infiltrate consistent with pneumonia noted. Low lung volumes. Heart size normal. Small left pleural effusion cannot be excluded. No pneumothorax . No acute bony abnormality IMPRESSION: Mild left lower lobe infiltrate consistent pneumonia. Low lung volumes. Small left pleural effusion. Electronically Signed   By: Marcello Moores  Register   On: 04/29/2016 12:54   Ct Angio Chest Pe W Or Wo Contrast  Result Date: 04/29/2016 CLINICAL DATA:  Acute left anterior chest pain EXAM: CT ANGIOGRAPHY CHEST WITH CONTRAST TECHNIQUE: Multidetector CT imaging of the chest was performed using the standard protocol during bolus administration of intravenous contrast. Multiplanar CT image reconstructions and MIPs were obtained to evaluate the vascular anatomy. CONTRAST:  80 cc Isovue 370 IV COMPARISON:  04/29/2016 chest radiograph FINDINGS: Cardiovascular: The study is of quality for the evaluation of pulmonary embolism. There are no filling defects in the central, lobar, segmental or subsegmental pulmonary artery branches to suggest acute pulmonary embolism. Great vessels are normal in course  and caliber. Normal heart size. No significant pericardial fluid/thickening. Mediastinum/Nodes: No discrete thyroid nodules. Unremarkable esophagus. No pathologically enlarged axillary, mediastinal or hilar lymph nodes. 0.8 cm short axis prevascular lymph node consistent. Lungs/Pleura: No pneumothorax. No pleural effusion. Bibasilar left greater than right pneumonic consolidations and atelectasis with small left parapneumonic effusion. Ground-glass opacities in the upper lobes, lingula and right middle lobe consistent with hypoventilatory change or possibly mild edema. Upper abdomen: Unremarkable. Musculoskeletal:  No aggressive appearing focal osseous lesions. Review of the MIP images confirms the above findings. IMPRESSION: Bibasilar pneumonic consolidations left greater than right with air bronchograms. Superimposed atelectasis also noted. Trace left pleural effusion. No pulmonary embolus. Low lung volumes likely accounting for hypoventilatory type changes and ground-glass appearance of the upper lobes, lingula and right middle lobe. Mild edema not entirely excluded. Electronically Signed   By: Shanon Brow  Randel Pigg M.D.   On: 04/29/2016 18:14    Procedures Procedures (including critical care time)  Medications Ordered in ED Medications  ketorolac (TORADOL) 30 MG/ML injection 30 mg (not administered)     Initial Impression / Assessment and Plan / ED Course  I have reviewed the triage vital signs and the nursing notes.  Pertinent labs & imaging results that were available during my care of the patient were reviewed by me and considered in my medical decision making (see chart for details).  Clinical Course   Patient with left-sided rib pain. Chest x-ray shows pneumonia. Patient has had a cough, but no fever. Additionally, patient does have factor V Leiden mutation, this sets her increased risk for developing PE. Were she does have some pleuritic symptoms, Dr. Ashok Cordia  agrees that CT scan to rule out PE is  indicated.  CT scan negative for PE. Will give doxycycline. Patient is allergic to penicillins, and cannot use fluoroquinolones due to other QT prolonging agents.  Will treat with doxy.  Final Clinical Impressions(s) / ED Diagnoses   Final diagnoses:  Community acquired pneumonia of left lung, unspecified part of lung    New Prescriptions New Prescriptions   BENZONATATE (TESSALON) 100 MG CAPSULE    Take 2 capsules (200 mg total) by mouth 2 (two) times daily as needed for cough.   DOXYCYCLINE (VIBRAMYCIN) 100 MG CAPSULE    Take 1 capsule (100 mg total) by mouth 2 (two) times daily.   IBUPROFEN (ADVIL,MOTRIN) 600 MG TABLET    Take 1 tablet (600 mg total) by mouth every 6 (six) hours as needed.     Montine Circle, PA-C 04/29/16 [place], MD 04/30/16 [phone]

## 2016-04-29 NOTE — ED Notes (Signed)
Pt did not have CTA due to 20g IV not being able to use (would not flush, painful)  Pt refused to be stuck again by CT and also refuses another stick by RN, will notify provider

## 2016-08-20 DIAGNOSIS — B182 Chronic viral hepatitis C: Secondary | ICD-10-CM | POA: Diagnosis not present

## 2016-09-18 DIAGNOSIS — L0390 Cellulitis, unspecified: Secondary | ICD-10-CM | POA: Diagnosis not present

## 2016-09-18 DIAGNOSIS — L02411 Cutaneous abscess of right axilla: Secondary | ICD-10-CM | POA: Diagnosis not present

## 2017-02-09 DIAGNOSIS — R079 Chest pain, unspecified: Secondary | ICD-10-CM | POA: Diagnosis not present

## 2017-03-02 DIAGNOSIS — R300 Dysuria: Secondary | ICD-10-CM | POA: Diagnosis not present

## 2017-03-02 DIAGNOSIS — N118 Other chronic tubulo-interstitial nephritis: Secondary | ICD-10-CM | POA: Diagnosis not present

## 2017-03-09 DIAGNOSIS — R072 Precordial pain: Secondary | ICD-10-CM | POA: Diagnosis not present

## 2017-03-09 DIAGNOSIS — R079 Chest pain, unspecified: Secondary | ICD-10-CM | POA: Diagnosis not present

## 2017-03-09 DIAGNOSIS — R0789 Other chest pain: Secondary | ICD-10-CM | POA: Diagnosis not present

## 2017-03-29 DIAGNOSIS — F319 Bipolar disorder, unspecified: Secondary | ICD-10-CM | POA: Diagnosis not present

## 2017-04-01 DIAGNOSIS — J4520 Mild intermittent asthma, uncomplicated: Secondary | ICD-10-CM | POA: Diagnosis not present

## 2017-04-01 DIAGNOSIS — M545 Low back pain: Secondary | ICD-10-CM | POA: Diagnosis not present

## 2017-04-01 DIAGNOSIS — G4700 Insomnia, unspecified: Secondary | ICD-10-CM | POA: Diagnosis not present

## 2017-04-01 DIAGNOSIS — G8929 Other chronic pain: Secondary | ICD-10-CM | POA: Diagnosis not present

## 2017-04-01 DIAGNOSIS — M5137 Other intervertebral disc degeneration, lumbosacral region: Secondary | ICD-10-CM | POA: Diagnosis not present

## 2017-04-01 DIAGNOSIS — F419 Anxiety disorder, unspecified: Secondary | ICD-10-CM | POA: Diagnosis not present

## 2017-04-01 DIAGNOSIS — G2581 Restless legs syndrome: Secondary | ICD-10-CM | POA: Diagnosis not present

## 2017-04-02 DIAGNOSIS — R87610 Atypical squamous cells of undetermined significance on cytologic smear of cervix (ASC-US): Secondary | ICD-10-CM | POA: Diagnosis not present

## 2017-04-02 DIAGNOSIS — Z113 Encounter for screening for infections with a predominantly sexual mode of transmission: Secondary | ICD-10-CM | POA: Diagnosis not present

## 2017-04-12 DIAGNOSIS — M5137 Other intervertebral disc degeneration, lumbosacral region: Secondary | ICD-10-CM | POA: Diagnosis not present

## 2017-04-12 DIAGNOSIS — G2581 Restless legs syndrome: Secondary | ICD-10-CM | POA: Diagnosis not present

## 2017-04-12 DIAGNOSIS — F309 Manic episode, unspecified: Secondary | ICD-10-CM | POA: Diagnosis not present

## 2017-04-12 DIAGNOSIS — B182 Chronic viral hepatitis C: Secondary | ICD-10-CM | POA: Diagnosis not present

## 2017-05-11 DIAGNOSIS — J00 Acute nasopharyngitis [common cold]: Secondary | ICD-10-CM | POA: Diagnosis not present

## 2017-05-11 DIAGNOSIS — R05 Cough: Secondary | ICD-10-CM | POA: Diagnosis not present

## 2017-05-11 DIAGNOSIS — R0602 Shortness of breath: Secondary | ICD-10-CM | POA: Diagnosis not present

## 2017-05-11 DIAGNOSIS — R0600 Dyspnea, unspecified: Secondary | ICD-10-CM | POA: Diagnosis not present

## 2017-05-23 DIAGNOSIS — F17220 Nicotine dependence, chewing tobacco, uncomplicated: Secondary | ICD-10-CM | POA: Diagnosis not present

## 2017-05-23 DIAGNOSIS — R0602 Shortness of breath: Secondary | ICD-10-CM | POA: Diagnosis not present

## 2017-05-23 DIAGNOSIS — R05 Cough: Secondary | ICD-10-CM | POA: Diagnosis not present

## 2017-05-23 DIAGNOSIS — F17210 Nicotine dependence, cigarettes, uncomplicated: Secondary | ICD-10-CM | POA: Diagnosis not present

## 2017-05-23 DIAGNOSIS — R079 Chest pain, unspecified: Secondary | ICD-10-CM | POA: Diagnosis not present

## 2017-05-23 DIAGNOSIS — M542 Cervicalgia: Secondary | ICD-10-CM | POA: Diagnosis not present

## 2017-05-24 DIAGNOSIS — G43109 Migraine with aura, not intractable, without status migrainosus: Secondary | ICD-10-CM | POA: Diagnosis not present

## 2017-06-04 DIAGNOSIS — J22 Unspecified acute lower respiratory infection: Secondary | ICD-10-CM | POA: Diagnosis not present

## 2017-06-07 DIAGNOSIS — J22 Unspecified acute lower respiratory infection: Secondary | ICD-10-CM | POA: Diagnosis not present

## 2017-06-21 DIAGNOSIS — J22 Unspecified acute lower respiratory infection: Secondary | ICD-10-CM | POA: Diagnosis not present

## 2017-06-21 DIAGNOSIS — B182 Chronic viral hepatitis C: Secondary | ICD-10-CM | POA: Diagnosis not present

## 2017-06-21 DIAGNOSIS — B179 Acute viral hepatitis, unspecified: Secondary | ICD-10-CM | POA: Diagnosis not present

## 2017-06-22 DIAGNOSIS — B179 Acute viral hepatitis, unspecified: Secondary | ICD-10-CM | POA: Diagnosis not present

## 2017-06-24 DIAGNOSIS — B179 Acute viral hepatitis, unspecified: Secondary | ICD-10-CM | POA: Diagnosis not present

## 2017-07-07 DIAGNOSIS — R109 Unspecified abdominal pain: Secondary | ICD-10-CM | POA: Diagnosis not present

## 2017-07-07 DIAGNOSIS — R102 Pelvic and perineal pain: Secondary | ICD-10-CM | POA: Diagnosis not present

## 2017-07-07 DIAGNOSIS — B1920 Unspecified viral hepatitis C without hepatic coma: Secondary | ICD-10-CM | POA: Diagnosis not present

## 2017-07-23 DIAGNOSIS — G4700 Insomnia, unspecified: Secondary | ICD-10-CM | POA: Diagnosis not present

## 2017-07-23 DIAGNOSIS — L309 Dermatitis, unspecified: Secondary | ICD-10-CM | POA: Diagnosis not present

## 2017-08-03 DIAGNOSIS — G4700 Insomnia, unspecified: Secondary | ICD-10-CM | POA: Diagnosis not present

## 2017-08-03 DIAGNOSIS — F309 Manic episode, unspecified: Secondary | ICD-10-CM | POA: Diagnosis not present

## 2017-08-03 DIAGNOSIS — L309 Dermatitis, unspecified: Secondary | ICD-10-CM | POA: Diagnosis not present

## 2017-08-12 DIAGNOSIS — B182 Chronic viral hepatitis C: Secondary | ICD-10-CM | POA: Diagnosis not present

## 2017-08-12 DIAGNOSIS — B1920 Unspecified viral hepatitis C without hepatic coma: Secondary | ICD-10-CM | POA: Diagnosis not present

## 2017-08-12 DIAGNOSIS — Z9851 Tubal ligation status: Secondary | ICD-10-CM | POA: Diagnosis not present

## 2017-08-23 DIAGNOSIS — F603 Borderline personality disorder: Secondary | ICD-10-CM | POA: Diagnosis not present

## 2017-10-19 DIAGNOSIS — N898 Other specified noninflammatory disorders of vagina: Secondary | ICD-10-CM | POA: Diagnosis not present

## 2017-10-19 DIAGNOSIS — A630 Anogenital (venereal) warts: Secondary | ICD-10-CM | POA: Diagnosis not present

## 2017-10-19 DIAGNOSIS — B9689 Other specified bacterial agents as the cause of diseases classified elsewhere: Secondary | ICD-10-CM | POA: Diagnosis not present

## 2017-10-19 DIAGNOSIS — Z3200 Encounter for pregnancy test, result unknown: Secondary | ICD-10-CM | POA: Diagnosis not present

## 2017-10-19 DIAGNOSIS — N760 Acute vaginitis: Secondary | ICD-10-CM | POA: Diagnosis not present

## 2017-11-09 DIAGNOSIS — R7989 Other specified abnormal findings of blood chemistry: Secondary | ICD-10-CM | POA: Diagnosis not present

## 2017-11-09 DIAGNOSIS — B182 Chronic viral hepatitis C: Secondary | ICD-10-CM | POA: Diagnosis not present

## 2017-11-17 DIAGNOSIS — N912 Amenorrhea, unspecified: Secondary | ICD-10-CM | POA: Diagnosis not present

## 2017-12-30 DIAGNOSIS — L0102 Bockhart's impetigo: Secondary | ICD-10-CM | POA: Diagnosis not present

## 2017-12-30 DIAGNOSIS — N760 Acute vaginitis: Secondary | ICD-10-CM | POA: Diagnosis not present

## 2018-01-25 DIAGNOSIS — L0102 Bockhart's impetigo: Secondary | ICD-10-CM | POA: Diagnosis not present

## 2018-01-25 DIAGNOSIS — N3000 Acute cystitis without hematuria: Secondary | ICD-10-CM | POA: Diagnosis not present

## 2018-01-25 DIAGNOSIS — R399 Unspecified symptoms and signs involving the genitourinary system: Secondary | ICD-10-CM | POA: Diagnosis not present

## 2018-04-27 DIAGNOSIS — F603 Borderline personality disorder: Secondary | ICD-10-CM | POA: Diagnosis not present

## 2018-04-29 DIAGNOSIS — R21 Rash and other nonspecific skin eruption: Secondary | ICD-10-CM | POA: Diagnosis not present

## 2018-04-29 DIAGNOSIS — Z880 Allergy status to penicillin: Secondary | ICD-10-CM | POA: Diagnosis not present

## 2018-04-29 DIAGNOSIS — F17210 Nicotine dependence, cigarettes, uncomplicated: Secondary | ICD-10-CM | POA: Diagnosis not present

## 2018-04-29 DIAGNOSIS — F1911 Other psychoactive substance abuse, in remission: Secondary | ICD-10-CM | POA: Diagnosis not present

## 2018-04-29 DIAGNOSIS — Z23 Encounter for immunization: Secondary | ICD-10-CM | POA: Diagnosis not present

## 2018-04-29 DIAGNOSIS — R109 Unspecified abdominal pain: Secondary | ICD-10-CM | POA: Diagnosis not present

## 2018-04-29 DIAGNOSIS — R600 Localized edema: Secondary | ICD-10-CM | POA: Diagnosis not present

## 2018-05-03 DIAGNOSIS — N3000 Acute cystitis without hematuria: Secondary | ICD-10-CM | POA: Diagnosis not present

## 2018-05-03 DIAGNOSIS — A599 Trichomoniasis, unspecified: Secondary | ICD-10-CM | POA: Diagnosis not present

## 2018-05-03 DIAGNOSIS — J4520 Mild intermittent asthma, uncomplicated: Secondary | ICD-10-CM | POA: Diagnosis not present

## 2018-07-04 DIAGNOSIS — F603 Borderline personality disorder: Secondary | ICD-10-CM | POA: Diagnosis not present

## 2018-07-27 IMAGING — CT CT ANGIO CHEST
2 of 8 series · 18 of 36 positions shown · IV contrast (Iohexol (Omnipaque 350))
Comparison: 04/29/2016 chest radiograph

CLINICAL DATA: Acute left anterior chest pain

EXAM:
CT ANGIOGRAPHY CHEST WITH CONTRAST
TECHNIQUE: Multidetector CT imaging of the chest was performed using the
standard protocol during bolus administration of intravenous
contrast. Multiplanar CT image reconstructions and MIPs were
obtained to evaluate the vascular anatomy.
CONTRAST:  80 cc Isovue 370 IV

[Series 402: thins pacs · axial · 0.68mm/px · z∈[+894,+1178]mm · 17 of 320 slices shown]
[im 18/320  lung]
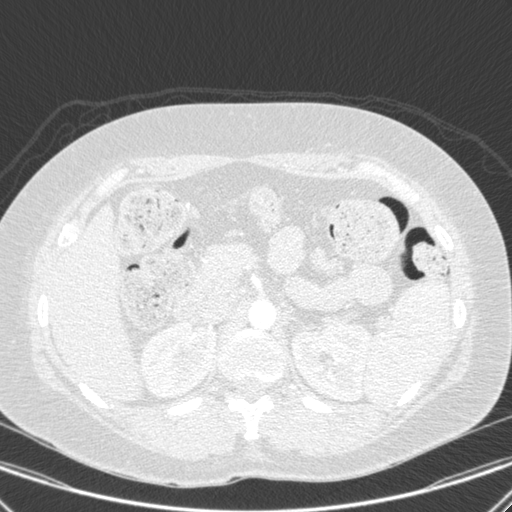
[im 36/320  mediastinal]
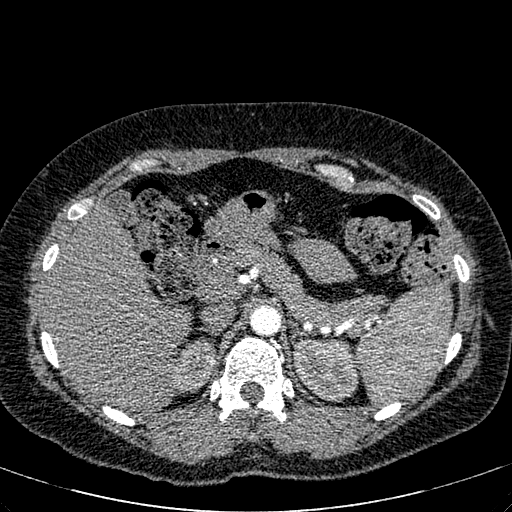
[im 54/320  lung]
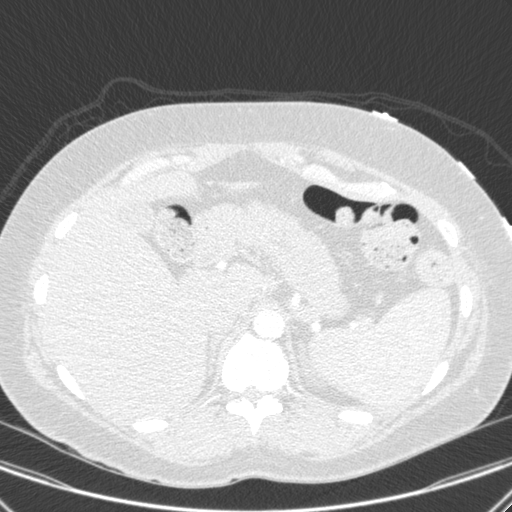
[im 71/320  mediastinal]
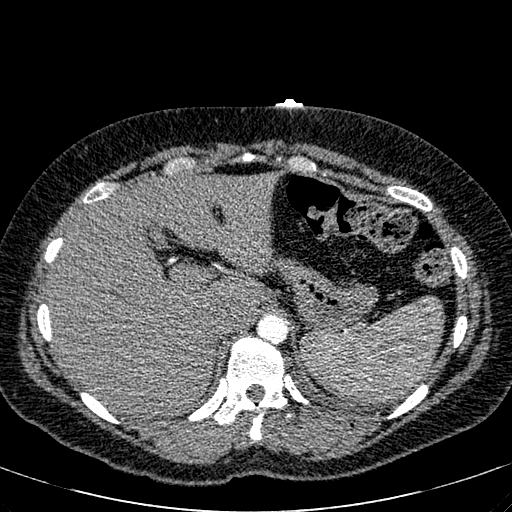
[im 89/320  lung]
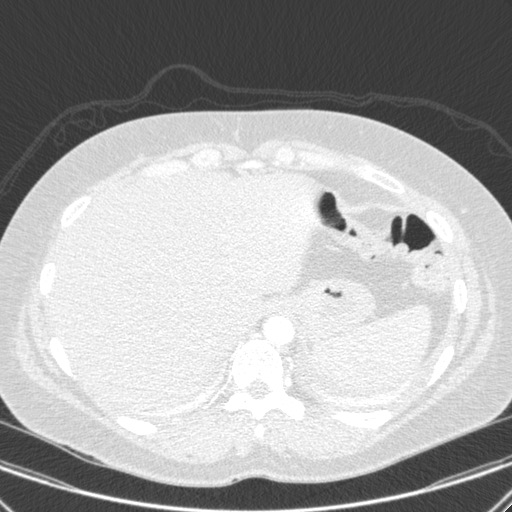
[im 107/320  mediastinal]
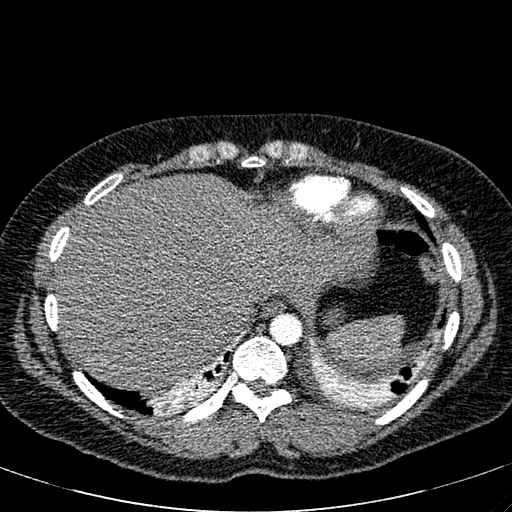
[im 125/320  lung]
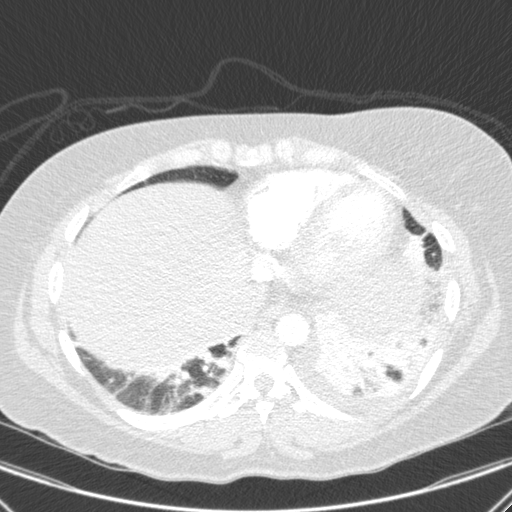
[im 142/320  mediastinal]
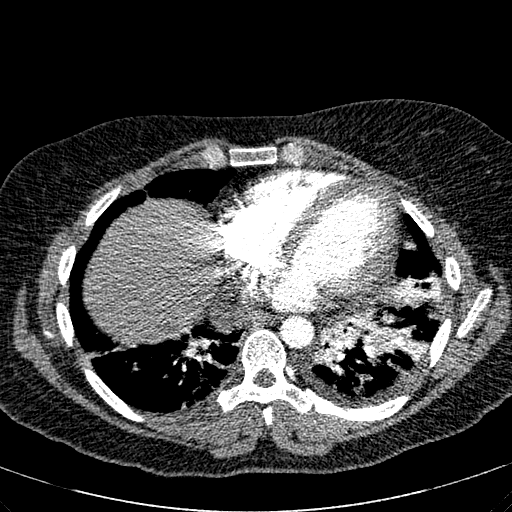
[im 160/320  lung]
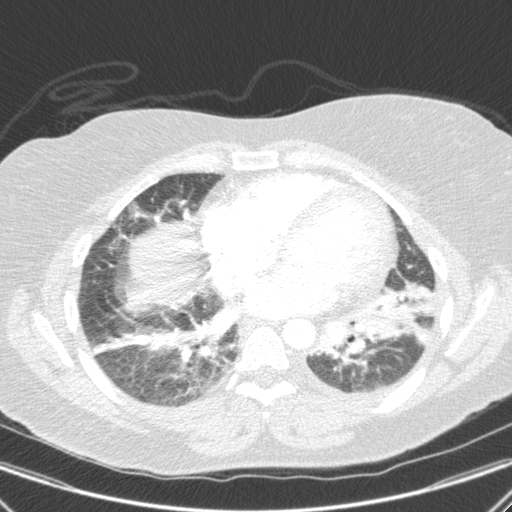
[im 178/320  mediastinal]
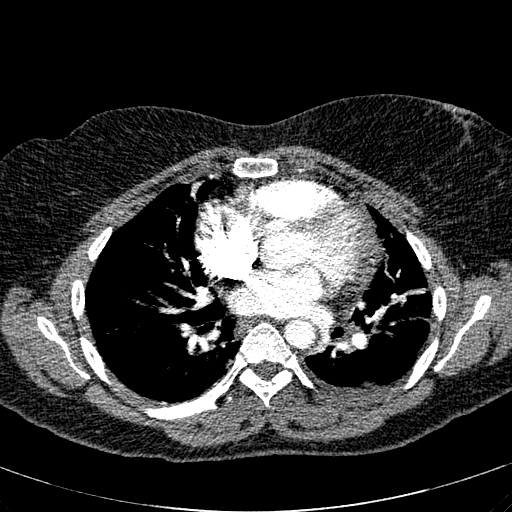
[im 195/320  lung]
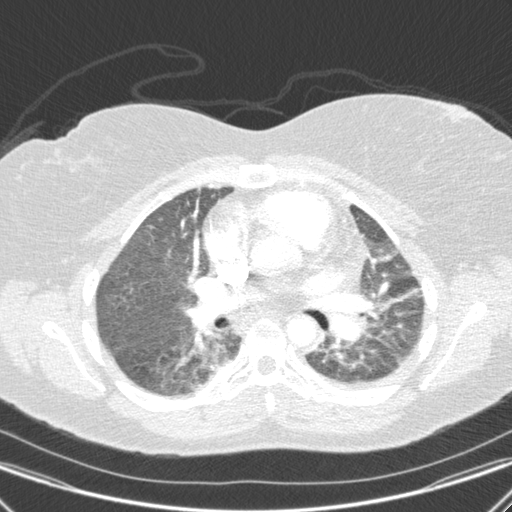
[im 213/320  mediastinal]
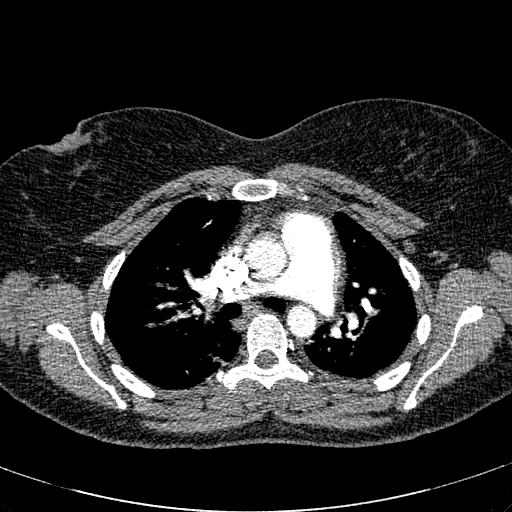
[im 231/320  lung]
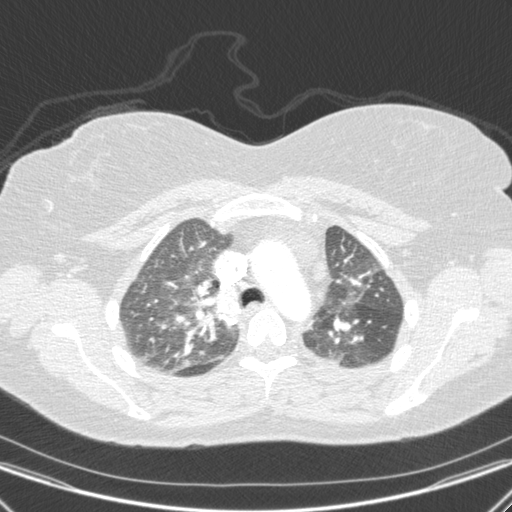
[im 249/320  mediastinal]
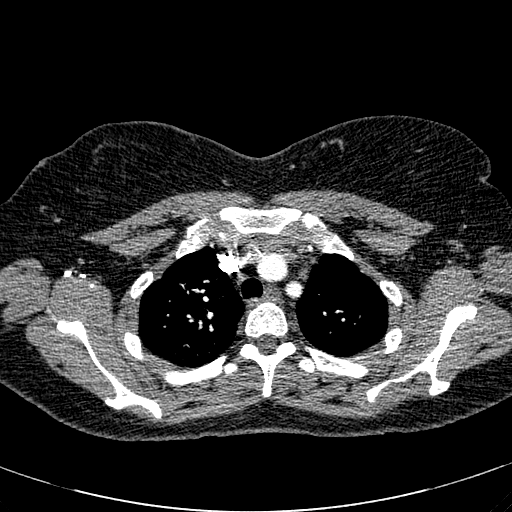
[im 266/320  lung]
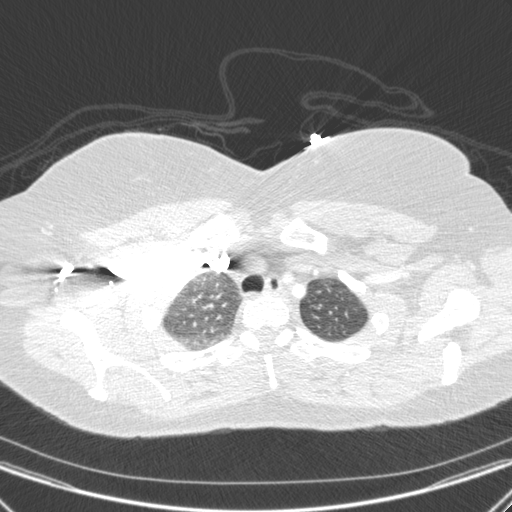
[im 284/320  mediastinal]
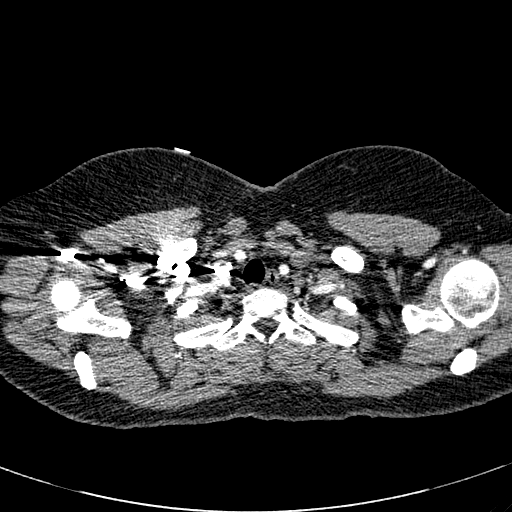
[im 302/320  lung]
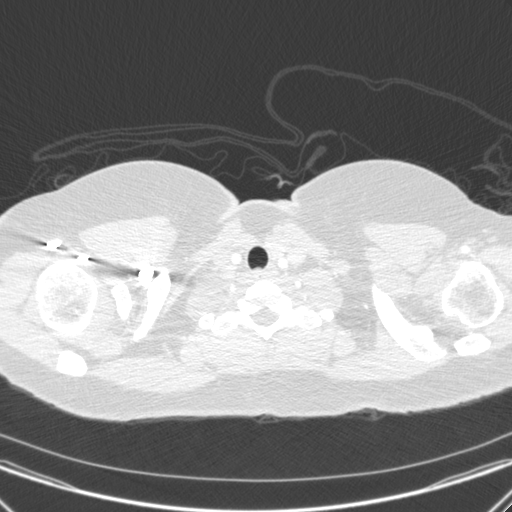

[Series 404: 2x2 mpr · coronal · 0.69mm/px · 1 of 104 slices shown]
[im 52/104  mediastinal]
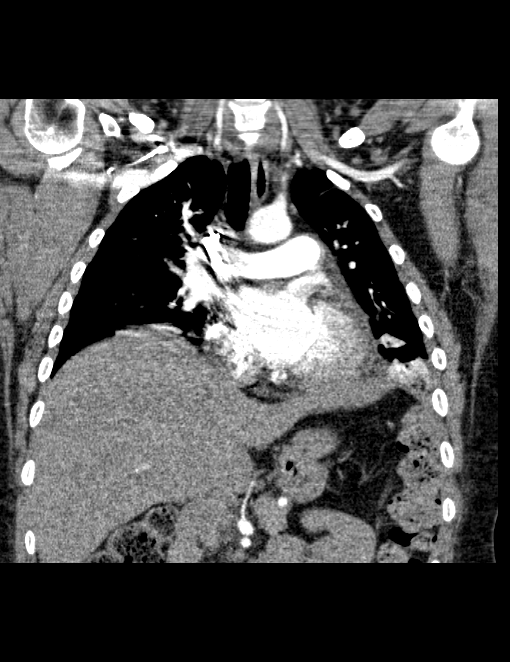

[18 of 36 positions shown; findings below may reference images not displayed]

FINDINGS: Cardiovascular: The study is of quality for the evaluation of
pulmonary embolism. There are no filling defects in the central,
lobar, segmental or subsegmental pulmonary artery branches to
suggest acute pulmonary embolism. Great vessels are normal in course
and caliber. Normal heart size. No significant pericardial
fluid/thickening.

Mediastinum/Nodes: No discrete thyroid nodules. Unremarkable
esophagus. No pathologically enlarged axillary, mediastinal or hilar
lymph nodes. 0.8 cm short axis prevascular lymph node consistent.

Lungs/Pleura: No pneumothorax. No pleural effusion. Bibasilar left
greater than right pneumonic consolidations and atelectasis with
small left parapneumonic effusion. Ground-glass opacities in the
upper lobes, lingula and right middle lobe consistent with
hypoventilatory change or possibly mild edema.

Upper abdomen: Unremarkable.

Musculoskeletal:  No aggressive appearing focal osseous lesions.

Review of the MIP images confirms the above findings.
IMPRESSION: Bibasilar pneumonic consolidations left greater than right with air
bronchograms. Superimposed atelectasis also noted. Trace left
pleural effusion.

No pulmonary embolus. Low lung volumes likely accounting for
hypoventilatory type changes and ground-glass appearance of the
upper lobes, lingula and right middle lobe. Mild edema not entirely
excluded.

## 2021-04-17 ENCOUNTER — Telehealth: Payer: Self-pay | Admitting: Gastroenterology

## 2021-04-17 NOTE — Telephone Encounter (Signed)
Pt called requesting to see Dr. Lyndel Safe as one her her relatives has strongly recommended him. Pt saw Dr. Melina Copa last August. She will have records faxed over for review.

## 2023-12-12 ENCOUNTER — Ambulatory Visit (HOSPITAL_BASED_OUTPATIENT_CLINIC_OR_DEPARTMENT_OTHER): Admission: EM | Admit: 2023-12-12 | Discharge: 2023-12-12 | Disposition: A | Discharge status: Home / Self Care

## 2023-12-12 ENCOUNTER — Encounter (HOSPITAL_BASED_OUTPATIENT_CLINIC_OR_DEPARTMENT_OTHER): Payer: Self-pay

## 2023-12-12 DIAGNOSIS — L559 Sunburn, unspecified: Secondary | ICD-10-CM | POA: Diagnosis not present

## 2023-12-12 MED ORDER — IBUPROFEN 800 MG PO TABS: 800.0000 mg | ORAL_TABLET | Freq: Three times a day (TID) | ORAL | 0 refills | Status: AC

## 2023-12-12 NOTE — ED Triage Notes (Signed)
 At beach on Friday and did not put on any sunscreen during 2 hours she was there. Patient states has been using aloe vera gel. Feet are swollen.

## 2023-12-12 NOTE — Discharge Instructions (Signed)
 Recommend ibuprofen  800 mg every 8 hours as needed for pain. Cool compresses to the areas or ice packs and elevate the legs Moisturizing with aloe vera Follow-up as needed

## 2023-12-15 NOTE — ED Provider Notes (Signed)
 [facility]    CSN: [id number] Arrival date & time: 12/12/23  1228      History   Chief Complaint Chief Complaint  Patient presents with   Sunburn    HPI Lisa Mendoza is a 43 y.o. female.   Patient is a 43 year old female who presents today with generalized sunburn.  Worse to the lower extremities.  She was at the beach this past week and did not applies any sunscreen.  She was out there for approximately 2 hours.  Since she has had swelling, erythema and pain to the lower extremities.  She has been using aloe vera lotion and gel.     Past Medical History:  Diagnosis Date   Anxiety    Bipolar 2 disorder ([facility])    Depression    Drug abuse ([facility])    heroin and meth   Factor 5 Leiden mutation, heterozygous ([facility])    Mania ([facility])    PTSD (post-traumatic stress disorder)     There are no active problems to display for this patient.   History reviewed. No pertinent surgical history.  OB History   No obstetric history on file.      Home Medications    Prior to Admission medications   Medication Sig Start Date End Date Taking? Authorizing Provider  amitriptyline (ELAVIL) 150 MG tablet Take 150 mg by mouth at bedtime. 11/05/23  Yes [provider]  ibuprofen  (ADVIL ) 800 MG tablet Take 1 tablet (800 mg total) by mouth 3 (three) times daily. 12/12/23  Yes Taavi Hoose A, FNP  Aspirin-Acetaminophen -Caffeine (GOODY HEADACHE PO) Take 1 packet by mouth daily as needed (headache).    [provider]  hydrOXYzine  (ATARAX /VISTARIL ) 50 MG tablet Take 50 mg by mouth 3 (three) times daily as needed for anxiety.    [provider]  lamoTRIgine (LAMICTAL) 100 MG tablet Take 100-200 mg by mouth See admin instructions. 100mg  in am, 200mg  in pm    [provider]  prazosin (MINIPRESS) 2 MG capsule Take 2 mg by mouth at bedtime.    [provider]  ramelteon (ROZEREM) 8 MG tablet Take 8 mg by mouth at bedtime.    [provider]   ziprasidone  (GEODON ) 40 MG capsule Take 40 mg by mouth 2 (two) times daily with a meal.    [provider]    Family History History reviewed. No pertinent family history.  Social History Social History   Tobacco Use   Smoking status: Former    Current packs/day: 0.50    Types: Cigarettes   Smokeless tobacco: Current    Types: Chew  Substance Use Topics   Alcohol use: No   Drug use: Yes    Types: IV, Marijuana, Cocaine     Comment: 10 days clean and on subaxone (04/29/16)     Allergies   Penicillins and Seroquel [quetiapine]   Review of Systems Review of Systems  See HPI Physical Exam Triage Vital Signs ED Triage Vitals  Encounter Vitals Group     BP 12/12/23 1345 122/88     Systolic BP Percentile --      Diastolic BP Percentile --      Pulse Rate 12/12/23 1345 96     Resp 12/12/23 1345 20     Temp 12/12/23 1345 98.1 F (36.7 C)     Temp Source 12/12/23 1345 Oral     SpO2 12/12/23 1345 96 %     Weight --      Height --  Head Circumference --      Peak Flow --      Pain Score 12/12/23 1347 8     Pain Loc --      Pain Education --      Exclude from Growth Chart --    No data found.  Updated Vital Signs BP 122/88 (BP Location: Left Arm)   Pulse 96   Temp 98.1 F (36.7 C) (Oral)   Resp 20   LMP 11/30/2023   SpO2 96%   Visual Acuity Right Eye Distance:   Left Eye Distance:   Bilateral Distance:    Right Eye Near:   Left Eye Near:    Bilateral Near:     Physical Exam Vitals and nursing note reviewed.  Constitutional:      General: She is not in acute distress.    Appearance: Normal appearance. She is not ill-appearing, toxic-appearing or diaphoretic.  Pulmonary:     Effort: Pulmonary effort is normal.  Musculoskeletal:     Right lower leg: Edema present.     Left lower leg: Edema present.  Skin:    General: Skin is warm and dry.     Findings: Erythema present.     Comments: Sunburn noted to bilateral lower extremities with  generalized swelling and tenderness  Neurological:     Mental Status: She is alert.  Psychiatric:        Mood and Affect: Mood normal.      UC Treatments / Results  Labs (all labs ordered are listed, but only abnormal results are displayed) Labs Reviewed - No data to display  EKG   Radiology No results found.  Procedures Procedures (including critical care time)  Medications Ordered in UC Medications - No data to display  Initial Impression / Assessment and Plan / UC Course  I have reviewed the triage vital signs and the nursing notes.  Pertinent labs & imaging results that were available during my care of the patient were reviewed by me and considered in my medical decision making (see chart for details).     Sunburn-recommended ibuprofen  800 mg every 8 hours for pain as needed.  Cool compresses to the area, ice packs and elevate the legs.  Continue to moisturize with aloe vera. Recommend in the future to wear sunscreen Follow-up as needed Final Clinical Impressions(s) / UC Diagnoses   Final diagnoses:  Sunburn     Discharge Instructions      Recommend ibuprofen  800 mg every 8 hours as needed for pain. Cool compresses to the areas or ice packs and elevate the legs Moisturizing with aloe vera Follow-up as needed  ED Prescriptions     Medication Sig Dispense Auth. Provider   ibuprofen  (ADVIL ) 800 MG tablet Take 1 tablet (800 mg total) by mouth 3 (three) times daily. 21 tablet Landa Pine, FNP      PDMP not reviewed this encounter.   Landa Pine, FNP 12/15/23 [phone]

## 2023-12-17 ENCOUNTER — Ambulatory Visit (HOSPITAL_BASED_OUTPATIENT_CLINIC_OR_DEPARTMENT_OTHER)
Admission: EM | Admit: 2023-12-17 | Discharge: 2023-12-17 | Disposition: A | Discharge status: Home / Self Care | Attending: Family Medicine | Admitting: Family Medicine

## 2023-12-17 ENCOUNTER — Encounter (HOSPITAL_BASED_OUTPATIENT_CLINIC_OR_DEPARTMENT_OTHER): Payer: Self-pay | Admitting: Emergency Medicine

## 2023-12-17 DIAGNOSIS — T300 Burn of unspecified body region, unspecified degree: Secondary | ICD-10-CM

## 2023-12-17 DIAGNOSIS — L03119 Cellulitis of unspecified part of limb: Secondary | ICD-10-CM | POA: Diagnosis not present

## 2023-12-17 MED ORDER — SILVER SULFADIAZINE 1 % EX CREA: 1.0000 | TOPICAL_CREAM | Freq: Every day | CUTANEOUS | 0 refills | Status: AC

## 2023-12-17 MED ORDER — DOXYCYCLINE HYCLATE 100 MG PO CAPS: 100.0000 mg | ORAL_CAPSULE | Freq: Two times a day (BID) | ORAL | 0 refills | Status: AC

## 2023-12-17 NOTE — ED Triage Notes (Signed)
 Pt was seen on 06/08 for sun burn she feels like the sunburn is getting worse. She is unable to wear shoes.

## 2023-12-17 NOTE — Discharge Instructions (Signed)
 Take the antibiotics as prescribed and use the cream as prescribed.  Continue to elevate the legs Follow-up for any continued issues

## 2023-12-17 NOTE — ED Provider Notes (Signed)
 [facility]    CSN: [id number] Arrival date & time: 12/17/23  1015      History   Chief Complaint No chief complaint on file.   HPI Lisa Mendoza is a 43 y.o. female.   43 year old female presents today with continued pain, issues since having a sunburn.  She was seen here on 6/8 and treated for second-degree burns due to sun exposure.  At that time was advised to continue with elevation of the legs, using aloe vera and keeping the legs moisturized, ibuprofen  for pain.  She has been doing this religiously.  She feels like now the legs are changing.  The sunburn to her chest is actually improving with peeling.  The pet legs have change in color and the pain still persist.  She does have swelling.  Denies any fevers, chills, body aches, nausea or vomiting     Past Medical History:  Diagnosis Date   Anxiety    Bipolar 2 disorder ([facility])    Depression    Drug abuse ([facility])    heroin and meth   Factor 5 Leiden mutation, heterozygous ([facility])    Mania ([facility])    PTSD (post-traumatic stress disorder)     There are no active problems to display for this patient.   History reviewed. No pertinent surgical history.  OB History   No obstetric history on file.      Home Medications    Prior to Admission medications   Medication Sig Start Date End Date Taking? Authorizing Provider  doxycycline  (VIBRAMYCIN ) 100 MG capsule Take 1 capsule (100 mg total) by mouth 2 (two) times daily for 7 days. 12/17/23 12/24/23 Yes Kayton Ripp A, FNP  silver sulfADIAZINE (SILVADENE) 1 % cream Apply 1 Application topically daily. 12/17/23  Yes Lakindra Wible A, FNP  amitriptyline (ELAVIL) 150 MG tablet Take 150 mg by mouth at bedtime. 11/05/23   [provider]  Aspirin-Acetaminophen -Caffeine (GOODY HEADACHE PO) Take 1 packet by mouth daily as needed (headache).    [provider]  hydrOXYzine  (ATARAX /VISTARIL ) 50 MG tablet Take 50 mg by mouth 3 (three) times daily as needed for  anxiety.    [provider]  ibuprofen  (ADVIL ) 800 MG tablet Take 1 tablet (800 mg total) by mouth 3 (three) times daily. 12/12/23   Landa Pine, FNP  lamoTRIgine (LAMICTAL) 100 MG tablet Take 100-200 mg by mouth See admin instructions. 100mg  in am, 200mg  in pm    [provider]  prazosin (MINIPRESS) 2 MG capsule Take 2 mg by mouth at bedtime.    [provider]  ramelteon (ROZEREM) 8 MG tablet Take 8 mg by mouth at bedtime.    [provider]  ziprasidone  (GEODON ) 40 MG capsule Take 40 mg by mouth 2 (two) times daily with a meal.    [provider]    Family History History reviewed. No pertinent family history.  Social History Social History   Tobacco Use   Smoking status: Former    Current packs/day: 0.50    Types: Cigarettes   Smokeless tobacco: Current    Types: Chew  Substance Use Topics   Alcohol use: No   Drug use: Yes    Types: IV, Marijuana, Cocaine     Comment: 10 days clean and on subaxone (04/29/16)     Allergies   Penicillins and Seroquel [quetiapine]   Review of Systems Review of Systems  See HPI Physical Exam Triage Vital Signs ED Triage Vitals  Encounter Vitals Group  BP 12/17/23 1024 112/78     Girls Systolic BP Percentile --      Girls Diastolic BP Percentile --      Boys Systolic BP Percentile --      Boys Diastolic BP Percentile --      Pulse Rate 12/17/23 1024 86     Resp 12/17/23 1024 18     Temp 12/17/23 1024 98.1 F (36.7 C)     Temp Source 12/17/23 1024 Oral     SpO2 12/17/23 1024 97 %     Weight --      Height --      Head Circumference --      Peak Flow --      Pain Score 12/17/23 1023 0     Pain Loc --      Pain Education --      Exclude from Growth Chart --    No data found.  Updated Vital Signs BP 112/78 (BP Location: Right Arm)   Pulse 86   Temp 98.1 F (36.7 C) (Oral)   Resp 18   LMP 11/30/2023   SpO2 97%   Visual Acuity Right Eye Distance:   Left Eye Distance:    Bilateral Distance:    Right Eye Near:   Left Eye Near:    Bilateral Near:     Physical Exam Vitals and nursing note reviewed.  Constitutional:      General: She is not in acute distress.    Appearance: Normal appearance. She is not ill-appearing, toxic-appearing or diaphoretic.  Pulmonary:     Effort: Pulmonary effort is normal.   Skin:    Comments: See picture for detail.  1+ pitting edema bilateral, 2+ pedal pulse bilateral    Neurological:     Mental Status: She is alert.   Psychiatric:        Mood and Affect: Mood normal.      UC Treatments / Results  Labs (all labs ordered are listed, but only abnormal results are displayed) Labs Reviewed - No data to display  EKG   Radiology No results found.  Procedures Procedures (including critical care time)  Medications Ordered in UC Medications - No data to display  Initial Impression / Assessment and Plan / UC Course  I have reviewed the triage vital signs and the nursing notes.  Pertinent labs & imaging results that were available during my care of the patient were reviewed by me and considered in my medical decision making (see chart for details).     Cellulitis-I believe most likely she has developed cellulitis secondary to the sunburns. Will go ahead and treat today with oral antibiotics with doxycycline  and give her some Silvadene cream to put on the legs daily. Continue to elevate the legs Close watch of the legs and follow-up for any continued issues Final Clinical Impressions(s) / UC Diagnoses   Final diagnoses:  Cellulitis of lower extremity, unspecified laterality  Burn     Discharge Instructions      Take the antibiotics as prescribed and use the cream as prescribed.  Continue to elevate the legs Follow-up for any continued issues    ED Prescriptions     Medication Sig Dispense Auth. Provider   doxycycline  (VIBRAMYCIN ) 100 MG capsule Take 1 capsule (100 mg total) by mouth 2 (two)  times daily for 7 days. 14 capsule Dulcinea Kinser A, FNP   silver sulfADIAZINE (SILVADENE) 1 % cream Apply 1 Application topically daily. 50 g Landa Pine, FNP  PDMP not reviewed this encounter.   Landa Pine, FNP 12/17/23 1154

## 2024-02-17 ENCOUNTER — Ambulatory Visit
Admission: RE | Admit: 2024-02-17 | Discharge: 2024-02-17 | Disposition: A | Discharge status: Home / Self Care | Source: Ambulatory Visit

## 2024-02-17 ENCOUNTER — Other Ambulatory Visit: Payer: Self-pay

## 2024-02-17 DIAGNOSIS — S86912A Strain of unspecified muscle(s) and tendon(s) at lower leg level, left leg, initial encounter: Secondary | ICD-10-CM
